# Patient Record
Sex: Male | Born: 1955
Health system: Southern US, Community
[De-identification: ages and names within clinical notes are randomized; demographics above are authoritative.]

---

## 2000-02-11 ENCOUNTER — Encounter: Admission: RE | Admit: 2000-02-11 | Discharge: 2000-02-11 | Payer: Self-pay

## 2000-02-11 ENCOUNTER — Encounter: Payer: Self-pay | Admitting: Otolaryngology

## 2001-12-12 ENCOUNTER — Encounter: Admission: RE | Admit: 2001-12-12 | Discharge: 2001-12-12 | Payer: Self-pay | Admitting: Internal Medicine

## 2001-12-12 ENCOUNTER — Encounter: Payer: Self-pay | Admitting: Internal Medicine

## 2009-10-23 ENCOUNTER — Ambulatory Visit (HOSPITAL_COMMUNITY): Admission: RE | Admit: 2009-10-23 | Discharge: 2009-10-23 | Payer: Self-pay | Admitting: Internal Medicine

## 2010-06-24 ENCOUNTER — Encounter: Admission: RE | Admit: 2010-06-24 | Discharge: 2010-06-24 | Payer: Self-pay | Admitting: Internal Medicine

## 2013-10-30 ENCOUNTER — Other Ambulatory Visit: Payer: Self-pay | Admitting: Internal Medicine

## 2013-10-30 ENCOUNTER — Ambulatory Visit
Admission: RE | Admit: 2013-10-30 | Discharge: 2013-10-30 | Disposition: A | Discharge status: Home / Self Care | Payer: BC Managed Care – PPO | Source: Ambulatory Visit | Attending: Internal Medicine | Admitting: Internal Medicine

## 2013-10-30 DIAGNOSIS — M542 Cervicalgia: Secondary | ICD-10-CM

## 2015-12-31 ENCOUNTER — Telehealth: Payer: Self-pay

## 2015-12-31 NOTE — Telephone Encounter (Signed)
The pt is scheduled to see Dr Eldridge Dace on 01/08/16. Per Dr Eldridge Dace the pt needs to have a GXT without an OV. I have left a message asking the pt to call me back.

## 2016-01-01 NOTE — Telephone Encounter (Signed)
Follow up     Patient calling back to speak with nurse from yesterday - no order in system to schedule GXT

## 2016-01-01 NOTE — Telephone Encounter (Signed)
LMTCB

## 2016-01-02 ENCOUNTER — Other Ambulatory Visit: Payer: Self-pay

## 2016-01-02 DIAGNOSIS — R0602 Shortness of breath: Secondary | ICD-10-CM

## 2016-01-02 NOTE — Telephone Encounter (Signed)
The pt is in agreement with having a GXT and is aware that his 2/16 appt with Dr Eldridge Dace has been cancelled.  I went over GXT instructions with the pt over the phone and answered all of his questions. He did verbalize understanding to all instructions given. He is aware that someone from scheduling will be contacting him to arrange date and time of GXT.

## 2016-01-08 ENCOUNTER — Encounter: Payer: Self-pay | Admitting: Interventional Cardiology

## 2016-01-08 ENCOUNTER — Ambulatory Visit (INDEPENDENT_AMBULATORY_CARE_PROVIDER_SITE_OTHER): Payer: BLUE CROSS/BLUE SHIELD

## 2016-01-08 DIAGNOSIS — R0602 Shortness of breath: Secondary | ICD-10-CM

## 2016-01-08 LAB — EXERCISE TOLERANCE TEST
Estimated workload: 7 METS
Exercise duration (min): 6 min
Exercise duration (sec): 0 s
MPHR: 163 {beats}/min
Peak HR: 157 {beats}/min
Percent HR: 96 %
RPE: 17
Rest HR: 102 {beats}/min

## 2016-02-23 DIAGNOSIS — M549 Dorsalgia, unspecified: Secondary | ICD-10-CM | POA: Diagnosis not present

## 2016-04-29 DIAGNOSIS — G43719 Chronic migraine without aura, intractable, without status migrainosus: Secondary | ICD-10-CM | POA: Diagnosis not present

## 2016-05-06 DIAGNOSIS — Z0000 Encounter for general adult medical examination without abnormal findings: Secondary | ICD-10-CM | POA: Diagnosis not present

## 2016-05-06 DIAGNOSIS — Z1389 Encounter for screening for other disorder: Secondary | ICD-10-CM | POA: Diagnosis not present

## 2016-05-10 DIAGNOSIS — R7309 Other abnormal glucose: Secondary | ICD-10-CM | POA: Diagnosis not present

## 2016-05-10 DIAGNOSIS — E039 Hypothyroidism, unspecified: Secondary | ICD-10-CM | POA: Diagnosis not present

## 2016-05-10 DIAGNOSIS — Z0000 Encounter for general adult medical examination without abnormal findings: Secondary | ICD-10-CM | POA: Diagnosis not present

## 2016-06-09 DIAGNOSIS — G43719 Chronic migraine without aura, intractable, without status migrainosus: Secondary | ICD-10-CM | POA: Diagnosis not present

## 2016-06-17 DIAGNOSIS — G43719 Chronic migraine without aura, intractable, without status migrainosus: Secondary | ICD-10-CM | POA: Diagnosis not present

## 2016-06-24 DIAGNOSIS — E782 Mixed hyperlipidemia: Secondary | ICD-10-CM | POA: Diagnosis not present

## 2016-08-03 DIAGNOSIS — G43719 Chronic migraine without aura, intractable, without status migrainosus: Secondary | ICD-10-CM | POA: Diagnosis not present

## 2016-08-05 DIAGNOSIS — M542 Cervicalgia: Secondary | ICD-10-CM | POA: Diagnosis not present

## 2016-08-05 DIAGNOSIS — G43719 Chronic migraine without aura, intractable, without status migrainosus: Secondary | ICD-10-CM | POA: Diagnosis not present

## 2016-08-05 DIAGNOSIS — G518 Other disorders of facial nerve: Secondary | ICD-10-CM | POA: Diagnosis not present

## 2016-08-05 DIAGNOSIS — M791 Myalgia: Secondary | ICD-10-CM | POA: Diagnosis not present

## 2016-08-10 DIAGNOSIS — Z23 Encounter for immunization: Secondary | ICD-10-CM | POA: Diagnosis not present

## 2016-08-10 DIAGNOSIS — E039 Hypothyroidism, unspecified: Secondary | ICD-10-CM | POA: Diagnosis not present

## 2016-08-10 DIAGNOSIS — I10 Essential (primary) hypertension: Secondary | ICD-10-CM | POA: Diagnosis not present

## 2016-08-19 DIAGNOSIS — G518 Other disorders of facial nerve: Secondary | ICD-10-CM | POA: Diagnosis not present

## 2016-08-19 DIAGNOSIS — M542 Cervicalgia: Secondary | ICD-10-CM | POA: Diagnosis not present

## 2016-08-19 DIAGNOSIS — G43719 Chronic migraine without aura, intractable, without status migrainosus: Secondary | ICD-10-CM | POA: Diagnosis not present

## 2016-08-19 DIAGNOSIS — M791 Myalgia: Secondary | ICD-10-CM | POA: Diagnosis not present

## 2016-09-01 DIAGNOSIS — M542 Cervicalgia: Secondary | ICD-10-CM | POA: Diagnosis not present

## 2016-09-01 DIAGNOSIS — G518 Other disorders of facial nerve: Secondary | ICD-10-CM | POA: Diagnosis not present

## 2016-09-01 DIAGNOSIS — M791 Myalgia: Secondary | ICD-10-CM | POA: Diagnosis not present

## 2016-09-01 DIAGNOSIS — G43719 Chronic migraine without aura, intractable, without status migrainosus: Secondary | ICD-10-CM | POA: Diagnosis not present

## 2016-09-14 DIAGNOSIS — G43719 Chronic migraine without aura, intractable, without status migrainosus: Secondary | ICD-10-CM | POA: Diagnosis not present

## 2016-09-16 DIAGNOSIS — G43719 Chronic migraine without aura, intractable, without status migrainosus: Secondary | ICD-10-CM | POA: Diagnosis not present

## 2016-10-06 DIAGNOSIS — G43719 Chronic migraine without aura, intractable, without status migrainosus: Secondary | ICD-10-CM | POA: Diagnosis not present

## 2016-10-06 DIAGNOSIS — G518 Other disorders of facial nerve: Secondary | ICD-10-CM | POA: Diagnosis not present

## 2016-10-06 DIAGNOSIS — M542 Cervicalgia: Secondary | ICD-10-CM | POA: Diagnosis not present

## 2016-10-06 DIAGNOSIS — M791 Myalgia: Secondary | ICD-10-CM | POA: Diagnosis not present

## 2016-10-11 DIAGNOSIS — Z87891 Personal history of nicotine dependence: Secondary | ICD-10-CM | POA: Diagnosis not present

## 2016-10-11 DIAGNOSIS — R040 Epistaxis: Secondary | ICD-10-CM | POA: Diagnosis not present

## 2016-10-11 DIAGNOSIS — J342 Deviated nasal septum: Secondary | ICD-10-CM | POA: Diagnosis not present

## 2016-10-11 DIAGNOSIS — J339 Nasal polyp, unspecified: Secondary | ICD-10-CM | POA: Diagnosis not present

## 2016-10-21 DIAGNOSIS — G43719 Chronic migraine without aura, intractable, without status migrainosus: Secondary | ICD-10-CM | POA: Diagnosis not present

## 2016-10-21 DIAGNOSIS — G518 Other disorders of facial nerve: Secondary | ICD-10-CM | POA: Diagnosis not present

## 2016-10-21 DIAGNOSIS — M542 Cervicalgia: Secondary | ICD-10-CM | POA: Diagnosis not present

## 2016-10-21 DIAGNOSIS — M791 Myalgia: Secondary | ICD-10-CM | POA: Diagnosis not present

## 2016-10-27 DIAGNOSIS — M5432 Sciatica, left side: Secondary | ICD-10-CM | POA: Diagnosis not present

## 2016-11-05 ENCOUNTER — Ambulatory Visit
Admission: RE | Admit: 2016-11-05 | Discharge: 2016-11-05 | Disposition: A | Discharge status: Home / Self Care | Payer: BLUE CROSS/BLUE SHIELD | Source: Ambulatory Visit | Attending: Internal Medicine | Admitting: Internal Medicine

## 2016-11-05 ENCOUNTER — Other Ambulatory Visit: Payer: Self-pay | Admitting: Internal Medicine

## 2016-11-05 DIAGNOSIS — M25552 Pain in left hip: Secondary | ICD-10-CM

## 2016-11-05 DIAGNOSIS — M25559 Pain in unspecified hip: Secondary | ICD-10-CM | POA: Diagnosis not present

## 2016-11-10 DIAGNOSIS — G43719 Chronic migraine without aura, intractable, without status migrainosus: Secondary | ICD-10-CM | POA: Diagnosis not present

## 2016-11-10 DIAGNOSIS — M542 Cervicalgia: Secondary | ICD-10-CM | POA: Diagnosis not present

## 2016-11-10 DIAGNOSIS — M791 Myalgia: Secondary | ICD-10-CM | POA: Diagnosis not present

## 2016-11-10 DIAGNOSIS — G518 Other disorders of facial nerve: Secondary | ICD-10-CM | POA: Diagnosis not present

## 2016-11-12 DIAGNOSIS — E7800 Pure hypercholesterolemia, unspecified: Secondary | ICD-10-CM | POA: Diagnosis not present

## 2016-11-12 DIAGNOSIS — K219 Gastro-esophageal reflux disease without esophagitis: Secondary | ICD-10-CM | POA: Diagnosis not present

## 2016-11-12 DIAGNOSIS — I10 Essential (primary) hypertension: Secondary | ICD-10-CM | POA: Diagnosis not present

## 2016-11-12 DIAGNOSIS — E039 Hypothyroidism, unspecified: Secondary | ICD-10-CM | POA: Diagnosis not present

## 2016-11-24 DIAGNOSIS — M25552 Pain in left hip: Secondary | ICD-10-CM | POA: Diagnosis not present

## 2016-11-30 DIAGNOSIS — M25552 Pain in left hip: Secondary | ICD-10-CM | POA: Diagnosis not present

## 2016-12-02 DIAGNOSIS — M25552 Pain in left hip: Secondary | ICD-10-CM | POA: Diagnosis not present

## 2016-12-07 DIAGNOSIS — M25552 Pain in left hip: Secondary | ICD-10-CM | POA: Diagnosis not present

## 2016-12-10 DIAGNOSIS — G43719 Chronic migraine without aura, intractable, without status migrainosus: Secondary | ICD-10-CM | POA: Diagnosis not present

## 2016-12-14 DIAGNOSIS — M25552 Pain in left hip: Secondary | ICD-10-CM | POA: Diagnosis not present

## 2016-12-16 DIAGNOSIS — G43719 Chronic migraine without aura, intractable, without status migrainosus: Secondary | ICD-10-CM | POA: Diagnosis not present

## 2016-12-16 DIAGNOSIS — M25552 Pain in left hip: Secondary | ICD-10-CM | POA: Diagnosis not present

## 2016-12-22 DIAGNOSIS — M25552 Pain in left hip: Secondary | ICD-10-CM | POA: Diagnosis not present

## 2016-12-30 DIAGNOSIS — M25552 Pain in left hip: Secondary | ICD-10-CM | POA: Diagnosis not present

## 2017-01-11 DIAGNOSIS — M25552 Pain in left hip: Secondary | ICD-10-CM | POA: Diagnosis not present

## 2017-01-20 DIAGNOSIS — F410 Panic disorder [episodic paroxysmal anxiety] without agoraphobia: Secondary | ICD-10-CM | POA: Diagnosis not present

## 2017-01-27 DIAGNOSIS — M542 Cervicalgia: Secondary | ICD-10-CM | POA: Diagnosis not present

## 2017-01-27 DIAGNOSIS — G43719 Chronic migraine without aura, intractable, without status migrainosus: Secondary | ICD-10-CM | POA: Diagnosis not present

## 2017-01-27 DIAGNOSIS — G518 Other disorders of facial nerve: Secondary | ICD-10-CM | POA: Diagnosis not present

## 2017-01-27 DIAGNOSIS — M791 Myalgia: Secondary | ICD-10-CM | POA: Diagnosis not present

## 2017-02-02 DIAGNOSIS — J069 Acute upper respiratory infection, unspecified: Secondary | ICD-10-CM | POA: Diagnosis not present

## 2017-02-02 DIAGNOSIS — J310 Chronic rhinitis: Secondary | ICD-10-CM | POA: Diagnosis not present

## 2017-03-02 DIAGNOSIS — G43719 Chronic migraine without aura, intractable, without status migrainosus: Secondary | ICD-10-CM | POA: Diagnosis not present

## 2017-03-17 DIAGNOSIS — G43719 Chronic migraine without aura, intractable, without status migrainosus: Secondary | ICD-10-CM | POA: Diagnosis not present

## 2017-04-21 DIAGNOSIS — M542 Cervicalgia: Secondary | ICD-10-CM | POA: Diagnosis not present

## 2017-04-21 DIAGNOSIS — G518 Other disorders of facial nerve: Secondary | ICD-10-CM | POA: Diagnosis not present

## 2017-04-21 DIAGNOSIS — G43719 Chronic migraine without aura, intractable, without status migrainosus: Secondary | ICD-10-CM | POA: Diagnosis not present

## 2017-04-21 DIAGNOSIS — M791 Myalgia: Secondary | ICD-10-CM | POA: Diagnosis not present

## 2017-05-19 DIAGNOSIS — Z0000 Encounter for general adult medical examination without abnormal findings: Secondary | ICD-10-CM | POA: Diagnosis not present

## 2017-05-19 DIAGNOSIS — J310 Chronic rhinitis: Secondary | ICD-10-CM | POA: Diagnosis not present

## 2017-05-19 DIAGNOSIS — E7800 Pure hypercholesterolemia, unspecified: Secondary | ICD-10-CM | POA: Diagnosis not present

## 2017-05-19 DIAGNOSIS — Z1389 Encounter for screening for other disorder: Secondary | ICD-10-CM | POA: Diagnosis not present

## 2017-05-19 DIAGNOSIS — E039 Hypothyroidism, unspecified: Secondary | ICD-10-CM | POA: Diagnosis not present

## 2017-05-19 DIAGNOSIS — K219 Gastro-esophageal reflux disease without esophagitis: Secondary | ICD-10-CM | POA: Diagnosis not present

## 2017-06-01 DIAGNOSIS — J069 Acute upper respiratory infection, unspecified: Secondary | ICD-10-CM | POA: Diagnosis not present

## 2017-06-04 DIAGNOSIS — G43719 Chronic migraine without aura, intractable, without status migrainosus: Secondary | ICD-10-CM | POA: Diagnosis not present

## 2017-06-16 DIAGNOSIS — G518 Other disorders of facial nerve: Secondary | ICD-10-CM | POA: Diagnosis not present

## 2017-06-16 DIAGNOSIS — M542 Cervicalgia: Secondary | ICD-10-CM | POA: Diagnosis not present

## 2017-06-16 DIAGNOSIS — G43719 Chronic migraine without aura, intractable, without status migrainosus: Secondary | ICD-10-CM | POA: Diagnosis not present

## 2017-06-16 DIAGNOSIS — M791 Myalgia: Secondary | ICD-10-CM | POA: Diagnosis not present

## 2017-08-04 DIAGNOSIS — G43719 Chronic migraine without aura, intractable, without status migrainosus: Secondary | ICD-10-CM | POA: Diagnosis not present

## 2017-08-04 DIAGNOSIS — M542 Cervicalgia: Secondary | ICD-10-CM | POA: Diagnosis not present

## 2017-08-04 DIAGNOSIS — M791 Myalgia: Secondary | ICD-10-CM | POA: Diagnosis not present

## 2017-08-04 DIAGNOSIS — G518 Other disorders of facial nerve: Secondary | ICD-10-CM | POA: Diagnosis not present

## 2017-08-08 DIAGNOSIS — F410 Panic disorder [episodic paroxysmal anxiety] without agoraphobia: Secondary | ICD-10-CM | POA: Diagnosis not present

## 2017-09-05 DIAGNOSIS — G43719 Chronic migraine without aura, intractable, without status migrainosus: Secondary | ICD-10-CM | POA: Diagnosis not present

## 2017-09-10 DIAGNOSIS — Z23 Encounter for immunization: Secondary | ICD-10-CM | POA: Diagnosis not present

## 2017-09-14 DIAGNOSIS — G518 Other disorders of facial nerve: Secondary | ICD-10-CM | POA: Diagnosis not present

## 2017-09-14 DIAGNOSIS — M542 Cervicalgia: Secondary | ICD-10-CM | POA: Diagnosis not present

## 2017-09-14 DIAGNOSIS — M791 Myalgia, unspecified site: Secondary | ICD-10-CM | POA: Diagnosis not present

## 2017-09-14 DIAGNOSIS — G43719 Chronic migraine without aura, intractable, without status migrainosus: Secondary | ICD-10-CM | POA: Diagnosis not present

## 2017-09-27 DIAGNOSIS — F410 Panic disorder [episodic paroxysmal anxiety] without agoraphobia: Secondary | ICD-10-CM | POA: Diagnosis not present

## 2017-10-26 DIAGNOSIS — G518 Other disorders of facial nerve: Secondary | ICD-10-CM | POA: Diagnosis not present

## 2017-10-26 DIAGNOSIS — M542 Cervicalgia: Secondary | ICD-10-CM | POA: Diagnosis not present

## 2017-10-26 DIAGNOSIS — G43719 Chronic migraine without aura, intractable, without status migrainosus: Secondary | ICD-10-CM | POA: Diagnosis not present

## 2017-10-26 DIAGNOSIS — M791 Myalgia, unspecified site: Secondary | ICD-10-CM | POA: Diagnosis not present

## 2017-12-01 DIAGNOSIS — K219 Gastro-esophageal reflux disease without esophagitis: Secondary | ICD-10-CM | POA: Diagnosis not present

## 2017-12-01 DIAGNOSIS — E039 Hypothyroidism, unspecified: Secondary | ICD-10-CM | POA: Diagnosis not present

## 2017-12-01 DIAGNOSIS — I10 Essential (primary) hypertension: Secondary | ICD-10-CM | POA: Diagnosis not present

## 2017-12-01 DIAGNOSIS — J310 Chronic rhinitis: Secondary | ICD-10-CM | POA: Diagnosis not present

## 2017-12-15 DIAGNOSIS — G43719 Chronic migraine without aura, intractable, without status migrainosus: Secondary | ICD-10-CM | POA: Diagnosis not present

## 2017-12-20 DIAGNOSIS — M542 Cervicalgia: Secondary | ICD-10-CM | POA: Diagnosis not present

## 2017-12-20 DIAGNOSIS — G518 Other disorders of facial nerve: Secondary | ICD-10-CM | POA: Diagnosis not present

## 2017-12-20 DIAGNOSIS — M791 Myalgia, unspecified site: Secondary | ICD-10-CM | POA: Diagnosis not present

## 2017-12-20 DIAGNOSIS — G43719 Chronic migraine without aura, intractable, without status migrainosus: Secondary | ICD-10-CM | POA: Diagnosis not present

## 2018-01-27 DIAGNOSIS — J069 Acute upper respiratory infection, unspecified: Secondary | ICD-10-CM | POA: Diagnosis not present

## 2018-01-27 DIAGNOSIS — J111 Influenza due to unidentified influenza virus with other respiratory manifestations: Secondary | ICD-10-CM | POA: Diagnosis not present

## 2018-02-15 DIAGNOSIS — M791 Myalgia, unspecified site: Secondary | ICD-10-CM | POA: Diagnosis not present

## 2018-02-15 DIAGNOSIS — G518 Other disorders of facial nerve: Secondary | ICD-10-CM | POA: Diagnosis not present

## 2018-02-15 DIAGNOSIS — M542 Cervicalgia: Secondary | ICD-10-CM | POA: Diagnosis not present

## 2018-02-15 DIAGNOSIS — G43719 Chronic migraine without aura, intractable, without status migrainosus: Secondary | ICD-10-CM | POA: Diagnosis not present

## 2018-02-21 DIAGNOSIS — J309 Allergic rhinitis, unspecified: Secondary | ICD-10-CM | POA: Diagnosis not present

## 2018-02-27 DIAGNOSIS — J339 Nasal polyp, unspecified: Secondary | ICD-10-CM | POA: Diagnosis not present

## 2018-02-27 DIAGNOSIS — J342 Deviated nasal septum: Secondary | ICD-10-CM | POA: Diagnosis not present

## 2018-03-07 DIAGNOSIS — G43719 Chronic migraine without aura, intractable, without status migrainosus: Secondary | ICD-10-CM | POA: Diagnosis not present

## 2018-03-14 DIAGNOSIS — M542 Cervicalgia: Secondary | ICD-10-CM | POA: Diagnosis not present

## 2018-03-14 DIAGNOSIS — G518 Other disorders of facial nerve: Secondary | ICD-10-CM | POA: Diagnosis not present

## 2018-03-14 DIAGNOSIS — M791 Myalgia, unspecified site: Secondary | ICD-10-CM | POA: Diagnosis not present

## 2018-03-14 DIAGNOSIS — G43719 Chronic migraine without aura, intractable, without status migrainosus: Secondary | ICD-10-CM | POA: Diagnosis not present

## 2018-04-27 DIAGNOSIS — M542 Cervicalgia: Secondary | ICD-10-CM | POA: Diagnosis not present

## 2018-04-27 DIAGNOSIS — M791 Myalgia, unspecified site: Secondary | ICD-10-CM | POA: Diagnosis not present

## 2018-04-27 DIAGNOSIS — G518 Other disorders of facial nerve: Secondary | ICD-10-CM | POA: Diagnosis not present

## 2018-04-27 DIAGNOSIS — G43719 Chronic migraine without aura, intractable, without status migrainosus: Secondary | ICD-10-CM | POA: Diagnosis not present

## 2018-05-16 DIAGNOSIS — G43719 Chronic migraine without aura, intractable, without status migrainosus: Secondary | ICD-10-CM | POA: Diagnosis not present

## 2018-05-22 DIAGNOSIS — Z0000 Encounter for general adult medical examination without abnormal findings: Secondary | ICD-10-CM | POA: Diagnosis not present

## 2018-05-22 DIAGNOSIS — E039 Hypothyroidism, unspecified: Secondary | ICD-10-CM | POA: Diagnosis not present

## 2018-05-23 DIAGNOSIS — E039 Hypothyroidism, unspecified: Secondary | ICD-10-CM | POA: Diagnosis not present

## 2018-05-23 DIAGNOSIS — Z0000 Encounter for general adult medical examination without abnormal findings: Secondary | ICD-10-CM | POA: Diagnosis not present

## 2018-05-23 DIAGNOSIS — I10 Essential (primary) hypertension: Secondary | ICD-10-CM | POA: Diagnosis not present

## 2018-05-23 DIAGNOSIS — E782 Mixed hyperlipidemia: Secondary | ICD-10-CM | POA: Diagnosis not present

## 2018-06-06 DIAGNOSIS — G518 Other disorders of facial nerve: Secondary | ICD-10-CM | POA: Diagnosis not present

## 2018-06-06 DIAGNOSIS — M791 Myalgia, unspecified site: Secondary | ICD-10-CM | POA: Diagnosis not present

## 2018-06-06 DIAGNOSIS — M542 Cervicalgia: Secondary | ICD-10-CM | POA: Diagnosis not present

## 2018-06-06 DIAGNOSIS — G43719 Chronic migraine without aura, intractable, without status migrainosus: Secondary | ICD-10-CM | POA: Diagnosis not present

## 2018-06-21 DIAGNOSIS — G43719 Chronic migraine without aura, intractable, without status migrainosus: Secondary | ICD-10-CM | POA: Diagnosis not present

## 2018-07-14 DIAGNOSIS — E782 Mixed hyperlipidemia: Secondary | ICD-10-CM | POA: Diagnosis not present

## 2018-07-17 DIAGNOSIS — Z0000 Encounter for general adult medical examination without abnormal findings: Secondary | ICD-10-CM | POA: Diagnosis not present

## 2018-07-19 DIAGNOSIS — G43719 Chronic migraine without aura, intractable, without status migrainosus: Secondary | ICD-10-CM | POA: Diagnosis not present

## 2018-07-19 DIAGNOSIS — M542 Cervicalgia: Secondary | ICD-10-CM | POA: Diagnosis not present

## 2018-07-19 DIAGNOSIS — G518 Other disorders of facial nerve: Secondary | ICD-10-CM | POA: Diagnosis not present

## 2018-07-19 DIAGNOSIS — M791 Myalgia, unspecified site: Secondary | ICD-10-CM | POA: Diagnosis not present

## 2018-08-05 DIAGNOSIS — Z23 Encounter for immunization: Secondary | ICD-10-CM | POA: Diagnosis not present

## 2018-08-09 DIAGNOSIS — G43719 Chronic migraine without aura, intractable, without status migrainosus: Secondary | ICD-10-CM | POA: Diagnosis not present

## 2018-08-17 DIAGNOSIS — K219 Gastro-esophageal reflux disease without esophagitis: Secondary | ICD-10-CM | POA: Diagnosis not present

## 2018-08-17 DIAGNOSIS — R195 Other fecal abnormalities: Secondary | ICD-10-CM | POA: Diagnosis not present

## 2018-09-06 DIAGNOSIS — G518 Other disorders of facial nerve: Secondary | ICD-10-CM | POA: Diagnosis not present

## 2018-09-06 DIAGNOSIS — G43719 Chronic migraine without aura, intractable, without status migrainosus: Secondary | ICD-10-CM | POA: Diagnosis not present

## 2018-09-06 DIAGNOSIS — M542 Cervicalgia: Secondary | ICD-10-CM | POA: Diagnosis not present

## 2018-09-06 DIAGNOSIS — M791 Myalgia, unspecified site: Secondary | ICD-10-CM | POA: Diagnosis not present

## 2018-09-13 DIAGNOSIS — M25511 Pain in right shoulder: Secondary | ICD-10-CM | POA: Diagnosis not present

## 2018-09-27 DIAGNOSIS — M25511 Pain in right shoulder: Secondary | ICD-10-CM | POA: Diagnosis not present

## 2018-10-11 DIAGNOSIS — M542 Cervicalgia: Secondary | ICD-10-CM | POA: Diagnosis not present

## 2018-10-11 DIAGNOSIS — G518 Other disorders of facial nerve: Secondary | ICD-10-CM | POA: Diagnosis not present

## 2018-10-11 DIAGNOSIS — M791 Myalgia, unspecified site: Secondary | ICD-10-CM | POA: Diagnosis not present

## 2018-10-11 DIAGNOSIS — G43719 Chronic migraine without aura, intractable, without status migrainosus: Secondary | ICD-10-CM | POA: Diagnosis not present

## 2018-11-24 DIAGNOSIS — E782 Mixed hyperlipidemia: Secondary | ICD-10-CM | POA: Diagnosis not present

## 2018-11-27 DIAGNOSIS — E039 Hypothyroidism, unspecified: Secondary | ICD-10-CM | POA: Diagnosis not present

## 2018-11-27 DIAGNOSIS — E782 Mixed hyperlipidemia: Secondary | ICD-10-CM | POA: Diagnosis not present

## 2018-11-27 DIAGNOSIS — Z1389 Encounter for screening for other disorder: Secondary | ICD-10-CM | POA: Diagnosis not present

## 2018-11-27 DIAGNOSIS — R51 Headache: Secondary | ICD-10-CM | POA: Diagnosis not present

## 2018-11-27 DIAGNOSIS — I10 Essential (primary) hypertension: Secondary | ICD-10-CM | POA: Diagnosis not present

## 2018-12-04 DIAGNOSIS — G43719 Chronic migraine without aura, intractable, without status migrainosus: Secondary | ICD-10-CM | POA: Diagnosis not present

## 2018-12-06 DIAGNOSIS — M791 Myalgia, unspecified site: Secondary | ICD-10-CM | POA: Diagnosis not present

## 2018-12-06 DIAGNOSIS — G43719 Chronic migraine without aura, intractable, without status migrainosus: Secondary | ICD-10-CM | POA: Diagnosis not present

## 2018-12-06 DIAGNOSIS — G518 Other disorders of facial nerve: Secondary | ICD-10-CM | POA: Diagnosis not present

## 2018-12-06 DIAGNOSIS — M542 Cervicalgia: Secondary | ICD-10-CM | POA: Diagnosis not present

## 2019-01-02 DIAGNOSIS — M7918 Myalgia, other site: Secondary | ICD-10-CM | POA: Diagnosis not present

## 2019-01-02 DIAGNOSIS — M5431 Sciatica, right side: Secondary | ICD-10-CM | POA: Diagnosis not present

## 2019-01-10 DIAGNOSIS — M79604 Pain in right leg: Secondary | ICD-10-CM | POA: Diagnosis not present

## 2019-01-10 DIAGNOSIS — M545 Low back pain: Secondary | ICD-10-CM | POA: Diagnosis not present

## 2019-01-15 DIAGNOSIS — M545 Low back pain: Secondary | ICD-10-CM | POA: Diagnosis not present

## 2019-01-15 DIAGNOSIS — M79604 Pain in right leg: Secondary | ICD-10-CM | POA: Diagnosis not present

## 2019-01-17 DIAGNOSIS — J339 Nasal polyp, unspecified: Secondary | ICD-10-CM | POA: Diagnosis not present

## 2019-01-17 DIAGNOSIS — J342 Deviated nasal septum: Secondary | ICD-10-CM | POA: Diagnosis not present

## 2019-01-17 DIAGNOSIS — J329 Chronic sinusitis, unspecified: Secondary | ICD-10-CM | POA: Diagnosis not present

## 2019-01-22 DIAGNOSIS — M545 Low back pain: Secondary | ICD-10-CM | POA: Diagnosis not present

## 2019-01-22 DIAGNOSIS — M79604 Pain in right leg: Secondary | ICD-10-CM | POA: Diagnosis not present

## 2019-01-24 DIAGNOSIS — M545 Low back pain: Secondary | ICD-10-CM | POA: Diagnosis not present

## 2019-01-24 DIAGNOSIS — M79604 Pain in right leg: Secondary | ICD-10-CM | POA: Diagnosis not present

## 2019-01-25 DIAGNOSIS — G43719 Chronic migraine without aura, intractable, without status migrainosus: Secondary | ICD-10-CM | POA: Diagnosis not present

## 2019-01-25 DIAGNOSIS — M791 Myalgia, unspecified site: Secondary | ICD-10-CM | POA: Diagnosis not present

## 2019-01-25 DIAGNOSIS — M542 Cervicalgia: Secondary | ICD-10-CM | POA: Diagnosis not present

## 2019-01-25 DIAGNOSIS — G518 Other disorders of facial nerve: Secondary | ICD-10-CM | POA: Diagnosis not present

## 2019-01-29 DIAGNOSIS — M545 Low back pain: Secondary | ICD-10-CM | POA: Diagnosis not present

## 2019-01-29 DIAGNOSIS — M79604 Pain in right leg: Secondary | ICD-10-CM | POA: Diagnosis not present

## 2019-01-31 DIAGNOSIS — D124 Benign neoplasm of descending colon: Secondary | ICD-10-CM | POA: Diagnosis not present

## 2019-01-31 DIAGNOSIS — R195 Other fecal abnormalities: Secondary | ICD-10-CM | POA: Diagnosis not present

## 2019-02-01 DIAGNOSIS — M79604 Pain in right leg: Secondary | ICD-10-CM | POA: Diagnosis not present

## 2019-02-01 DIAGNOSIS — M545 Low back pain: Secondary | ICD-10-CM | POA: Diagnosis not present

## 2019-02-02 DIAGNOSIS — D124 Benign neoplasm of descending colon: Secondary | ICD-10-CM | POA: Diagnosis not present

## 2019-02-06 DIAGNOSIS — M545 Low back pain: Secondary | ICD-10-CM | POA: Diagnosis not present

## 2019-02-06 DIAGNOSIS — M79604 Pain in right leg: Secondary | ICD-10-CM | POA: Diagnosis not present

## 2019-02-08 DIAGNOSIS — M545 Low back pain: Secondary | ICD-10-CM | POA: Diagnosis not present

## 2019-02-08 DIAGNOSIS — M79604 Pain in right leg: Secondary | ICD-10-CM | POA: Diagnosis not present

## 2019-02-12 DIAGNOSIS — M545 Low back pain: Secondary | ICD-10-CM | POA: Diagnosis not present

## 2019-02-12 DIAGNOSIS — M79604 Pain in right leg: Secondary | ICD-10-CM | POA: Diagnosis not present

## 2019-02-15 DIAGNOSIS — M79604 Pain in right leg: Secondary | ICD-10-CM | POA: Diagnosis not present

## 2019-02-15 DIAGNOSIS — M545 Low back pain: Secondary | ICD-10-CM | POA: Diagnosis not present

## 2019-02-20 DIAGNOSIS — M545 Low back pain: Secondary | ICD-10-CM | POA: Diagnosis not present

## 2019-02-20 DIAGNOSIS — M79604 Pain in right leg: Secondary | ICD-10-CM | POA: Diagnosis not present

## 2019-02-22 DIAGNOSIS — M79604 Pain in right leg: Secondary | ICD-10-CM | POA: Diagnosis not present

## 2019-02-22 DIAGNOSIS — M545 Low back pain: Secondary | ICD-10-CM | POA: Diagnosis not present

## 2019-03-19 DIAGNOSIS — G43719 Chronic migraine without aura, intractable, without status migrainosus: Secondary | ICD-10-CM | POA: Diagnosis not present

## 2019-03-21 DIAGNOSIS — G43719 Chronic migraine without aura, intractable, without status migrainosus: Secondary | ICD-10-CM | POA: Diagnosis not present

## 2019-03-21 DIAGNOSIS — M542 Cervicalgia: Secondary | ICD-10-CM | POA: Diagnosis not present

## 2019-03-21 DIAGNOSIS — G518 Other disorders of facial nerve: Secondary | ICD-10-CM | POA: Diagnosis not present

## 2019-03-21 DIAGNOSIS — M791 Myalgia, unspecified site: Secondary | ICD-10-CM | POA: Diagnosis not present

## 2019-04-09 DIAGNOSIS — M5136 Other intervertebral disc degeneration, lumbar region: Secondary | ICD-10-CM | POA: Diagnosis not present

## 2019-04-26 DIAGNOSIS — J040 Acute laryngitis: Secondary | ICD-10-CM | POA: Diagnosis not present

## 2019-04-26 DIAGNOSIS — J309 Allergic rhinitis, unspecified: Secondary | ICD-10-CM | POA: Diagnosis not present

## 2019-04-26 DIAGNOSIS — R0981 Nasal congestion: Secondary | ICD-10-CM | POA: Diagnosis not present

## 2019-05-03 DIAGNOSIS — M542 Cervicalgia: Secondary | ICD-10-CM | POA: Diagnosis not present

## 2019-05-03 DIAGNOSIS — G43719 Chronic migraine without aura, intractable, without status migrainosus: Secondary | ICD-10-CM | POA: Diagnosis not present

## 2019-05-24 DIAGNOSIS — Z5181 Encounter for therapeutic drug level monitoring: Secondary | ICD-10-CM | POA: Diagnosis not present

## 2019-05-24 DIAGNOSIS — E039 Hypothyroidism, unspecified: Secondary | ICD-10-CM | POA: Diagnosis not present

## 2019-05-24 DIAGNOSIS — E782 Mixed hyperlipidemia: Secondary | ICD-10-CM | POA: Diagnosis not present

## 2019-05-24 DIAGNOSIS — Z131 Encounter for screening for diabetes mellitus: Secondary | ICD-10-CM | POA: Diagnosis not present

## 2019-05-24 DIAGNOSIS — Z125 Encounter for screening for malignant neoplasm of prostate: Secondary | ICD-10-CM | POA: Diagnosis not present

## 2019-05-24 DIAGNOSIS — I10 Essential (primary) hypertension: Secondary | ICD-10-CM | POA: Diagnosis not present

## 2019-05-28 DIAGNOSIS — E782 Mixed hyperlipidemia: Secondary | ICD-10-CM | POA: Diagnosis not present

## 2019-05-28 DIAGNOSIS — Z0000 Encounter for general adult medical examination without abnormal findings: Secondary | ICD-10-CM | POA: Diagnosis not present

## 2019-05-28 DIAGNOSIS — I10 Essential (primary) hypertension: Secondary | ICD-10-CM | POA: Diagnosis not present

## 2019-05-28 DIAGNOSIS — K219 Gastro-esophageal reflux disease without esophagitis: Secondary | ICD-10-CM | POA: Diagnosis not present

## 2019-06-20 DIAGNOSIS — G43719 Chronic migraine without aura, intractable, without status migrainosus: Secondary | ICD-10-CM | POA: Diagnosis not present

## 2019-07-04 DIAGNOSIS — G43719 Chronic migraine without aura, intractable, without status migrainosus: Secondary | ICD-10-CM | POA: Diagnosis not present

## 2019-07-04 DIAGNOSIS — M542 Cervicalgia: Secondary | ICD-10-CM | POA: Diagnosis not present

## 2019-08-08 DIAGNOSIS — M545 Low back pain: Secondary | ICD-10-CM | POA: Diagnosis not present

## 2019-08-08 DIAGNOSIS — M519 Unspecified thoracic, thoracolumbar and lumbosacral intervertebral disc disorder: Secondary | ICD-10-CM | POA: Diagnosis not present

## 2019-08-16 DIAGNOSIS — M542 Cervicalgia: Secondary | ICD-10-CM | POA: Diagnosis not present

## 2019-08-16 DIAGNOSIS — G43719 Chronic migraine without aura, intractable, without status migrainosus: Secondary | ICD-10-CM | POA: Diagnosis not present

## 2019-09-13 DIAGNOSIS — G43719 Chronic migraine without aura, intractable, without status migrainosus: Secondary | ICD-10-CM | POA: Diagnosis not present

## 2019-10-03 DIAGNOSIS — G43719 Chronic migraine without aura, intractable, without status migrainosus: Secondary | ICD-10-CM | POA: Diagnosis not present

## 2019-10-03 DIAGNOSIS — M542 Cervicalgia: Secondary | ICD-10-CM | POA: Diagnosis not present

## 2019-11-10 DIAGNOSIS — Z20828 Contact with and (suspected) exposure to other viral communicable diseases: Secondary | ICD-10-CM | POA: Diagnosis not present

## 2019-11-13 DIAGNOSIS — G43719 Chronic migraine without aura, intractable, without status migrainosus: Secondary | ICD-10-CM | POA: Diagnosis not present

## 2019-11-13 DIAGNOSIS — M542 Cervicalgia: Secondary | ICD-10-CM | POA: Diagnosis not present

## 2019-11-29 DIAGNOSIS — I10 Essential (primary) hypertension: Secondary | ICD-10-CM | POA: Diagnosis not present

## 2019-11-29 DIAGNOSIS — Z23 Encounter for immunization: Secondary | ICD-10-CM | POA: Diagnosis not present

## 2019-11-29 DIAGNOSIS — K219 Gastro-esophageal reflux disease without esophagitis: Secondary | ICD-10-CM | POA: Diagnosis not present

## 2019-11-29 DIAGNOSIS — F410 Panic disorder [episodic paroxysmal anxiety] without agoraphobia: Secondary | ICD-10-CM | POA: Diagnosis not present

## 2019-11-29 DIAGNOSIS — E7800 Pure hypercholesterolemia, unspecified: Secondary | ICD-10-CM | POA: Diagnosis not present

## 2019-12-20 DIAGNOSIS — G43719 Chronic migraine without aura, intractable, without status migrainosus: Secondary | ICD-10-CM | POA: Diagnosis not present

## 2020-01-02 DIAGNOSIS — G43719 Chronic migraine without aura, intractable, without status migrainosus: Secondary | ICD-10-CM | POA: Diagnosis not present

## 2020-01-02 DIAGNOSIS — M542 Cervicalgia: Secondary | ICD-10-CM | POA: Diagnosis not present

## 2020-02-13 DIAGNOSIS — M542 Cervicalgia: Secondary | ICD-10-CM | POA: Diagnosis not present

## 2020-02-13 DIAGNOSIS — G43719 Chronic migraine without aura, intractable, without status migrainosus: Secondary | ICD-10-CM | POA: Diagnosis not present

## 2020-02-19 DIAGNOSIS — R197 Diarrhea, unspecified: Secondary | ICD-10-CM | POA: Diagnosis not present

## 2020-02-19 DIAGNOSIS — R509 Fever, unspecified: Secondary | ICD-10-CM | POA: Diagnosis not present

## 2020-02-19 DIAGNOSIS — Z1152 Encounter for screening for COVID-19: Secondary | ICD-10-CM | POA: Diagnosis not present

## 2020-02-21 DIAGNOSIS — R509 Fever, unspecified: Secondary | ICD-10-CM | POA: Diagnosis not present

## 2020-02-21 DIAGNOSIS — R197 Diarrhea, unspecified: Secondary | ICD-10-CM | POA: Diagnosis not present

## 2020-03-17 DIAGNOSIS — G43719 Chronic migraine without aura, intractable, without status migrainosus: Secondary | ICD-10-CM | POA: Diagnosis not present

## 2020-04-02 DIAGNOSIS — G43719 Chronic migraine without aura, intractable, without status migrainosus: Secondary | ICD-10-CM | POA: Diagnosis not present

## 2020-04-02 DIAGNOSIS — M542 Cervicalgia: Secondary | ICD-10-CM | POA: Diagnosis not present

## 2020-05-13 ENCOUNTER — Telehealth: Payer: Self-pay

## 2020-05-13 DIAGNOSIS — I10 Essential (primary) hypertension: Secondary | ICD-10-CM

## 2020-05-13 DIAGNOSIS — R0602 Shortness of breath: Secondary | ICD-10-CM

## 2020-05-13 NOTE — Telephone Encounter (Signed)
-----   Message from Corky Crafts, MD sent at 05/08/2020  2:35 PM EDT ----- Regarding: RE: calcium scoring CT That is fine.  I can do that.  OK to order.  He is aware of the cost.  Do you call him or does he just have to call and schedule once order is in?  JV ----- Message ----- From: Lattie Haw, RN Sent: 05/08/2020  10:59 AM EDT To: Corky Crafts, MD, Lattie Haw, RN Subject: RE: calcium scoring CT                         Yes we can order a Calcium score CT without a visit since it is not ran through insurance as long as we call results and address any findings.  ----- Message ----- From: Corky Crafts, MD Sent: 05/07/2020   4:06 PM EDT To: Lattie Haw, RN Subject: calcium scoring CT                             [place],   I saw this patient years ago but not recently.  Can I still order a calcium scoring CT scan without a visit?  JV

## 2020-05-13 NOTE — Telephone Encounter (Signed)
Left message for patient to call back  

## 2020-05-14 DIAGNOSIS — G43719 Chronic migraine without aura, intractable, without status migrainosus: Secondary | ICD-10-CM | POA: Diagnosis not present

## 2020-05-14 DIAGNOSIS — M542 Cervicalgia: Secondary | ICD-10-CM | POA: Diagnosis not present

## 2020-05-14 NOTE — Telephone Encounter (Signed)
Left message to call back  Order for Calcium Score CT has been placed.

## 2020-05-27 ENCOUNTER — Other Ambulatory Visit: Payer: Self-pay

## 2020-05-27 ENCOUNTER — Ambulatory Visit (INDEPENDENT_AMBULATORY_CARE_PROVIDER_SITE_OTHER)
Admission: RE | Admit: 2020-05-27 | Discharge: 2020-05-27 | Disposition: A | Discharge status: Home / Self Care | Payer: Self-pay | Source: Ambulatory Visit | Attending: Interventional Cardiology | Admitting: Interventional Cardiology

## 2020-05-27 DIAGNOSIS — R0602 Shortness of breath: Secondary | ICD-10-CM

## 2020-05-27 DIAGNOSIS — I10 Essential (primary) hypertension: Secondary | ICD-10-CM

## 2020-05-28 NOTE — Telephone Encounter (Signed)
Patient had Calcium Score CT done yesterday.

## 2020-05-30 ENCOUNTER — Other Ambulatory Visit: Payer: Self-pay

## 2020-05-30 DIAGNOSIS — I2510 Atherosclerotic heart disease of native coronary artery without angina pectoris: Secondary | ICD-10-CM

## 2020-05-30 DIAGNOSIS — R931 Abnormal findings on diagnostic imaging of heart and coronary circulation: Secondary | ICD-10-CM

## 2020-06-03 DIAGNOSIS — Z0000 Encounter for general adult medical examination without abnormal findings: Secondary | ICD-10-CM | POA: Diagnosis not present

## 2020-06-03 DIAGNOSIS — E782 Mixed hyperlipidemia: Secondary | ICD-10-CM | POA: Diagnosis not present

## 2020-06-03 DIAGNOSIS — Z23 Encounter for immunization: Secondary | ICD-10-CM | POA: Diagnosis not present

## 2020-06-03 DIAGNOSIS — I10 Essential (primary) hypertension: Secondary | ICD-10-CM | POA: Diagnosis not present

## 2020-06-03 DIAGNOSIS — K219 Gastro-esophageal reflux disease without esophagitis: Secondary | ICD-10-CM | POA: Diagnosis not present

## 2020-06-04 ENCOUNTER — Telehealth: Payer: Self-pay | Admitting: Interventional Cardiology

## 2020-06-04 NOTE — Telephone Encounter (Signed)
Called patient back to let him know our office would update his list. Patient stated his Crestor was increased to 40 mg. Informed patient that he would not need to hold any of his medications for his ETT . Patient verbalized understanding.

## 2020-06-04 NOTE — Telephone Encounter (Signed)
    Pt c/o medication issue:  1. Name of Medication: levothyroxine (SYNTHROID) 88 MCG tablet  losartan-hydrochlorothiazide (HYZAAR) 100-12.5 MG tablet    pantoprazole (PROTONIX) 40 MG tablet    rosuvastatin (CRESTOR) 20 MG tablet    2. How are you currently taking this medication (dosage and times per day)?   3. Are you having a reaction (difficulty breathing--STAT)?   4. What is your medication issue? Pt said per [place] he needs to call to give medications he currently taking, he said this is for his upcoming ETT to let him know if he needs to hold some meds prior procedure

## 2020-06-10 ENCOUNTER — Ambulatory Visit (INDEPENDENT_AMBULATORY_CARE_PROVIDER_SITE_OTHER): Payer: BC Managed Care – PPO

## 2020-06-10 ENCOUNTER — Other Ambulatory Visit: Payer: Self-pay

## 2020-06-10 DIAGNOSIS — R931 Abnormal findings on diagnostic imaging of heart and coronary circulation: Secondary | ICD-10-CM | POA: Diagnosis not present

## 2020-06-10 DIAGNOSIS — I2510 Atherosclerotic heart disease of native coronary artery without angina pectoris: Secondary | ICD-10-CM | POA: Diagnosis not present

## 2020-06-10 LAB — EXERCISE TOLERANCE TEST
Estimated workload: 7 METS
Exercise duration (min): 4 min
Exercise duration (sec): 44 s
MPHR: 158 {beats}/min
Peak HR: 142 {beats}/min
Percent HR: 89 %
RPE: 15
Rest HR: 77 {beats}/min

## 2020-06-18 DIAGNOSIS — G43719 Chronic migraine without aura, intractable, without status migrainosus: Secondary | ICD-10-CM | POA: Diagnosis not present

## 2020-07-02 DIAGNOSIS — G43719 Chronic migraine without aura, intractable, without status migrainosus: Secondary | ICD-10-CM | POA: Diagnosis not present

## 2020-07-02 DIAGNOSIS — M542 Cervicalgia: Secondary | ICD-10-CM | POA: Diagnosis not present

## 2020-07-03 DIAGNOSIS — Z13220 Encounter for screening for lipoid disorders: Secondary | ICD-10-CM | POA: Diagnosis not present

## 2020-07-03 DIAGNOSIS — Z125 Encounter for screening for malignant neoplasm of prostate: Secondary | ICD-10-CM | POA: Diagnosis not present

## 2020-07-03 DIAGNOSIS — Z0000 Encounter for general adult medical examination without abnormal findings: Secondary | ICD-10-CM | POA: Diagnosis not present

## 2020-07-03 DIAGNOSIS — Z131 Encounter for screening for diabetes mellitus: Secondary | ICD-10-CM | POA: Diagnosis not present

## 2020-08-19 DIAGNOSIS — G43719 Chronic migraine without aura, intractable, without status migrainosus: Secondary | ICD-10-CM | POA: Diagnosis not present

## 2020-08-19 DIAGNOSIS — M542 Cervicalgia: Secondary | ICD-10-CM | POA: Diagnosis not present

## 2020-09-15 DIAGNOSIS — G43719 Chronic migraine without aura, intractable, without status migrainosus: Secondary | ICD-10-CM | POA: Diagnosis not present

## 2020-10-08 DIAGNOSIS — M542 Cervicalgia: Secondary | ICD-10-CM | POA: Diagnosis not present

## 2020-10-08 DIAGNOSIS — G43719 Chronic migraine without aura, intractable, without status migrainosus: Secondary | ICD-10-CM | POA: Diagnosis not present

## 2020-11-19 DIAGNOSIS — M542 Cervicalgia: Secondary | ICD-10-CM | POA: Diagnosis not present

## 2020-11-19 DIAGNOSIS — G43719 Chronic migraine without aura, intractable, without status migrainosus: Secondary | ICD-10-CM | POA: Diagnosis not present

## 2020-12-05 DIAGNOSIS — I10 Essential (primary) hypertension: Secondary | ICD-10-CM | POA: Diagnosis not present

## 2020-12-05 DIAGNOSIS — E782 Mixed hyperlipidemia: Secondary | ICD-10-CM | POA: Diagnosis not present

## 2020-12-05 DIAGNOSIS — F410 Panic disorder [episodic paroxysmal anxiety] without agoraphobia: Secondary | ICD-10-CM | POA: Diagnosis not present

## 2020-12-05 DIAGNOSIS — J309 Allergic rhinitis, unspecified: Secondary | ICD-10-CM | POA: Diagnosis not present

## 2020-12-10 DIAGNOSIS — G43719 Chronic migraine without aura, intractable, without status migrainosus: Secondary | ICD-10-CM | POA: Diagnosis not present

## 2020-12-11 ENCOUNTER — Other Ambulatory Visit: Payer: Self-pay | Admitting: Internal Medicine

## 2020-12-11 ENCOUNTER — Ambulatory Visit
Admission: RE | Admit: 2020-12-11 | Discharge: 2020-12-11 | Disposition: A | Discharge status: Home / Self Care | Payer: BC Managed Care – PPO | Source: Ambulatory Visit | Attending: Internal Medicine | Admitting: Internal Medicine

## 2020-12-11 DIAGNOSIS — M25521 Pain in right elbow: Secondary | ICD-10-CM

## 2021-01-13 DIAGNOSIS — R0981 Nasal congestion: Secondary | ICD-10-CM | POA: Diagnosis not present

## 2021-01-13 DIAGNOSIS — J339 Nasal polyp, unspecified: Secondary | ICD-10-CM | POA: Diagnosis not present

## 2021-04-14 DIAGNOSIS — M25521 Pain in right elbow: Secondary | ICD-10-CM | POA: Diagnosis not present

## 2021-05-04 DIAGNOSIS — R000 Tachycardia, unspecified: Secondary | ICD-10-CM | POA: Diagnosis not present

## 2021-05-06 DIAGNOSIS — M25521 Pain in right elbow: Secondary | ICD-10-CM | POA: Diagnosis not present

## 2021-06-02 DIAGNOSIS — Z5181 Encounter for therapeutic drug level monitoring: Secondary | ICD-10-CM | POA: Diagnosis not present

## 2021-06-02 DIAGNOSIS — Z0000 Encounter for general adult medical examination without abnormal findings: Secondary | ICD-10-CM | POA: Diagnosis not present

## 2021-06-02 DIAGNOSIS — E7800 Pure hypercholesterolemia, unspecified: Secondary | ICD-10-CM | POA: Diagnosis not present

## 2021-06-02 DIAGNOSIS — Z125 Encounter for screening for malignant neoplasm of prostate: Secondary | ICD-10-CM | POA: Diagnosis not present

## 2021-06-05 DIAGNOSIS — Z0000 Encounter for general adult medical examination without abnormal findings: Secondary | ICD-10-CM | POA: Diagnosis not present

## 2021-07-13 IMAGING — CR DG ELBOW COMPLETE 3+V*R*
4 series · 4 of 4 positions shown · non-contrast
Comparison: None.

CLINICAL DATA: Post fall 1 month ago with persistent elbow pain.

EXAM:
RIGHT ELBOW - COMPLETE 3+ VIEW

[x elbow joint ap right (1 of 3)]
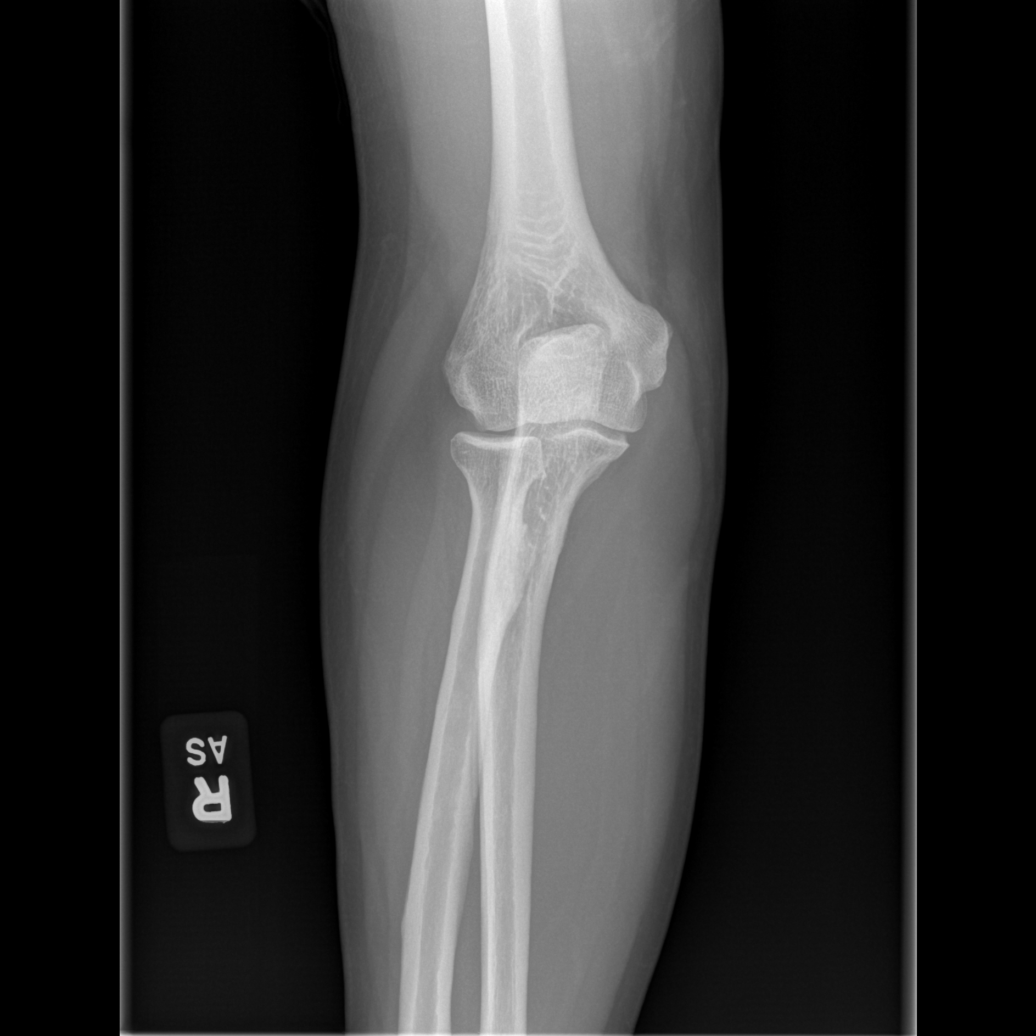

[x elbow joint lat right]
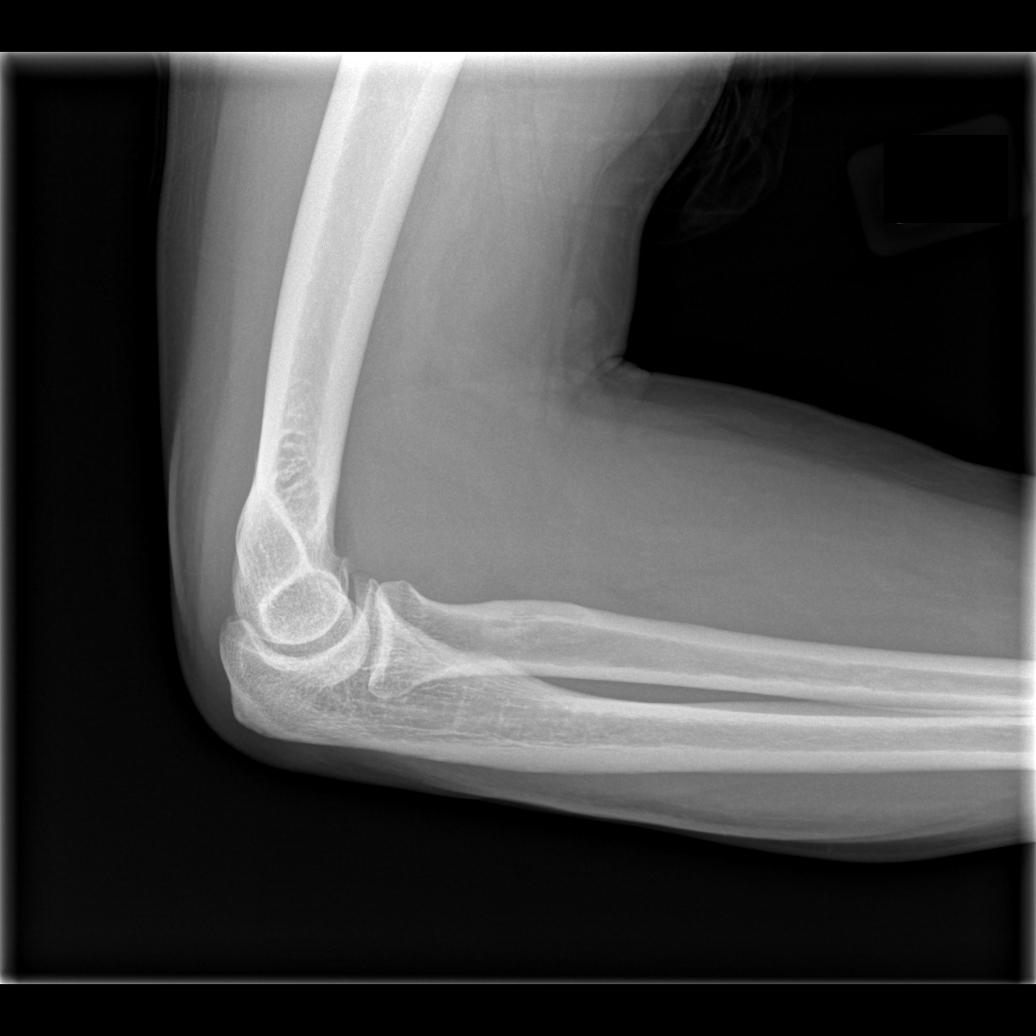

[x elbow joint ap right (2 of 3)]
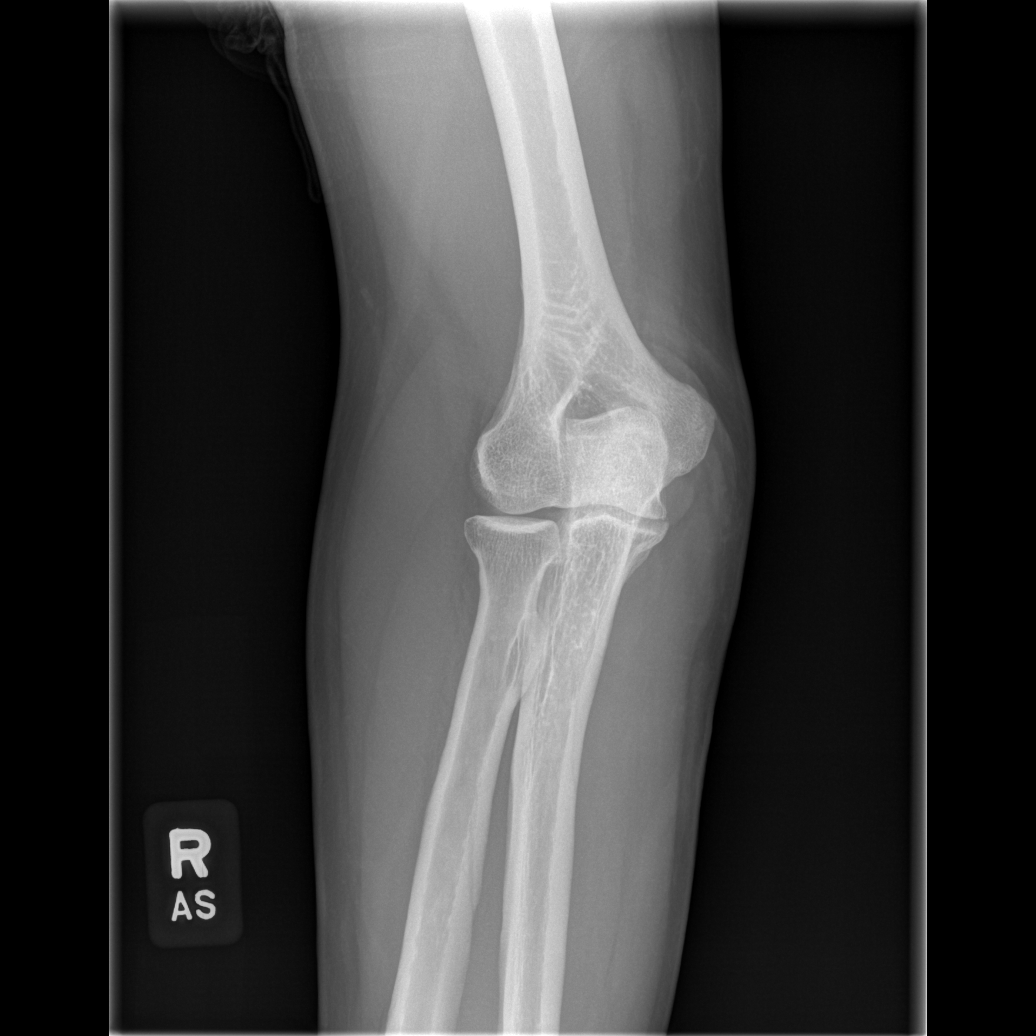

[x elbow joint ap right (3 of 3)]
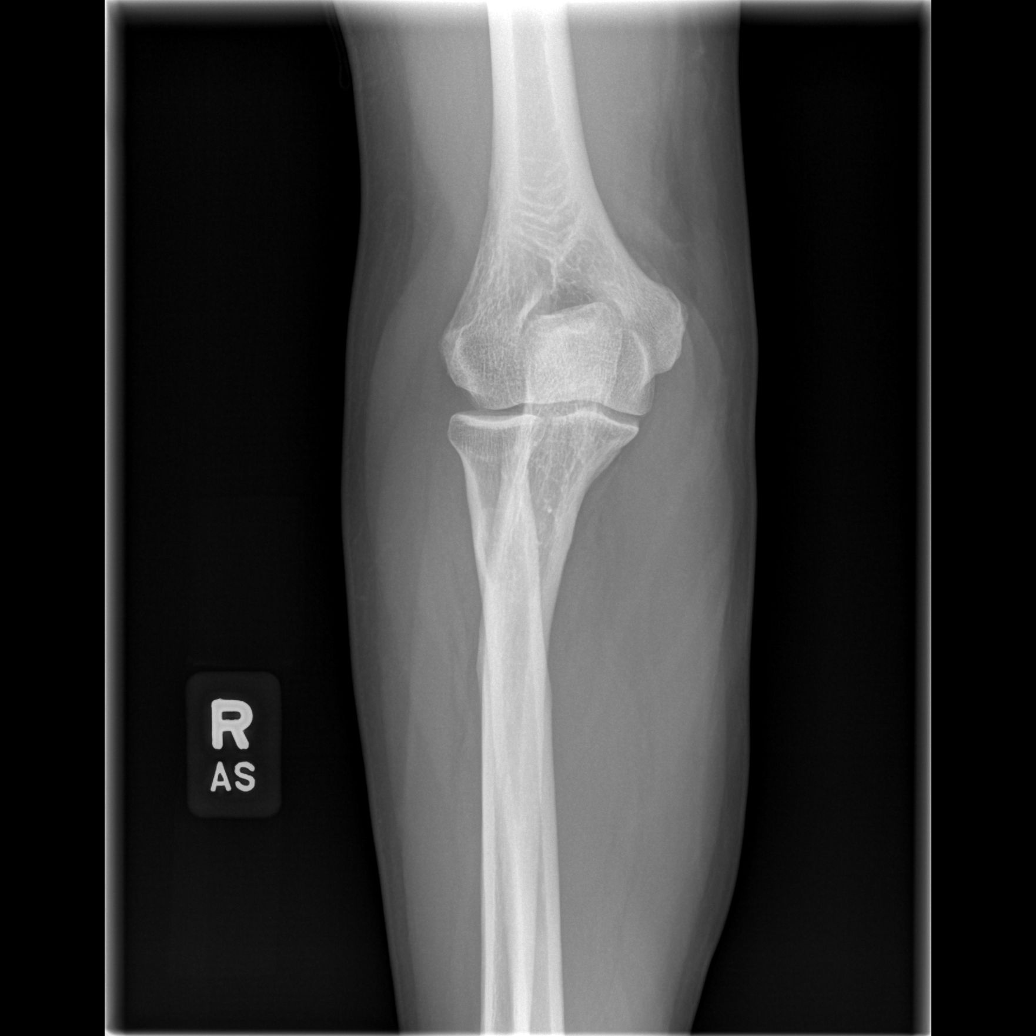

[4 of 4 positions shown; findings below may reference images not displayed]

FINDINGS: No fracture or elbow joint effusion. Joint spaces appear preserved.
Regional soft tissues appear normal.
IMPRESSION: No fracture or elbow joint effusion.

## 2021-07-23 DIAGNOSIS — L918 Other hypertrophic disorders of the skin: Secondary | ICD-10-CM | POA: Diagnosis not present

## 2021-09-07 DIAGNOSIS — E039 Hypothyroidism, unspecified: Secondary | ICD-10-CM | POA: Diagnosis not present

## 2021-09-11 ENCOUNTER — Telehealth: Payer: Self-pay | Admitting: Interventional Cardiology

## 2021-09-11 DIAGNOSIS — R051 Acute cough: Secondary | ICD-10-CM | POA: Diagnosis not present

## 2021-09-11 DIAGNOSIS — R6883 Chills (without fever): Secondary | ICD-10-CM | POA: Diagnosis not present

## 2021-09-11 DIAGNOSIS — J029 Acute pharyngitis, unspecified: Secondary | ICD-10-CM | POA: Diagnosis not present

## 2021-09-11 DIAGNOSIS — U071 COVID-19: Secondary | ICD-10-CM | POA: Diagnosis not present

## 2021-09-11 NOTE — Telephone Encounter (Signed)
   Patient called reporting that he tested positive for COVID today.  Symptoms of sore throat stated yesterday and he tested negative at that time on a home test.    Given HTN, hyperlipidemia, and coronary calcification, will treat with 5 days of Paxlovid.  Patient instructed to hold Crestor while on Paxlovid.   Rx called in to [facility] [phone]  Corky Crafts, MD

## 2021-10-21 DIAGNOSIS — E039 Hypothyroidism, unspecified: Secondary | ICD-10-CM | POA: Diagnosis not present

## 2021-12-16 ENCOUNTER — Other Ambulatory Visit: Payer: Self-pay | Admitting: Internal Medicine

## 2021-12-16 DIAGNOSIS — R911 Solitary pulmonary nodule: Secondary | ICD-10-CM

## 2021-12-16 DIAGNOSIS — K219 Gastro-esophageal reflux disease without esophagitis: Secondary | ICD-10-CM | POA: Diagnosis not present

## 2021-12-16 DIAGNOSIS — I10 Essential (primary) hypertension: Secondary | ICD-10-CM | POA: Diagnosis not present

## 2021-12-16 DIAGNOSIS — R0789 Other chest pain: Secondary | ICD-10-CM | POA: Diagnosis not present

## 2021-12-16 DIAGNOSIS — E782 Mixed hyperlipidemia: Secondary | ICD-10-CM | POA: Diagnosis not present

## 2022-01-11 ENCOUNTER — Other Ambulatory Visit: Payer: Self-pay

## 2022-01-11 ENCOUNTER — Ambulatory Visit
Admission: RE | Admit: 2022-01-11 | Discharge: 2022-01-11 | Disposition: A | Discharge status: Home / Self Care | Payer: BC Managed Care – PPO | Source: Ambulatory Visit | Attending: Internal Medicine | Admitting: Internal Medicine

## 2022-01-11 DIAGNOSIS — R911 Solitary pulmonary nodule: Secondary | ICD-10-CM | POA: Diagnosis not present

## 2022-10-25 DIAGNOSIS — J029 Acute pharyngitis, unspecified: Secondary | ICD-10-CM | POA: Diagnosis not present

## 2022-10-25 DIAGNOSIS — J069 Acute upper respiratory infection, unspecified: Secondary | ICD-10-CM | POA: Diagnosis not present

## 2022-11-01 DIAGNOSIS — J0100 Acute maxillary sinusitis, unspecified: Secondary | ICD-10-CM | POA: Diagnosis not present

## 2022-11-01 DIAGNOSIS — J40 Bronchitis, not specified as acute or chronic: Secondary | ICD-10-CM | POA: Diagnosis not present

## 2022-12-13 DIAGNOSIS — Z0000 Encounter for general adult medical examination without abnormal findings: Secondary | ICD-10-CM | POA: Diagnosis not present

## 2022-12-14 DIAGNOSIS — E782 Mixed hyperlipidemia: Secondary | ICD-10-CM | POA: Diagnosis not present

## 2022-12-14 DIAGNOSIS — R7309 Other abnormal glucose: Secondary | ICD-10-CM | POA: Diagnosis not present

## 2022-12-14 DIAGNOSIS — Z0000 Encounter for general adult medical examination without abnormal findings: Secondary | ICD-10-CM | POA: Diagnosis not present

## 2022-12-14 DIAGNOSIS — Z125 Encounter for screening for malignant neoplasm of prostate: Secondary | ICD-10-CM | POA: Diagnosis not present

## 2022-12-14 DIAGNOSIS — E039 Hypothyroidism, unspecified: Secondary | ICD-10-CM | POA: Diagnosis not present

## 2022-12-14 DIAGNOSIS — I10 Essential (primary) hypertension: Secondary | ICD-10-CM | POA: Diagnosis not present

## 2023-01-11 DIAGNOSIS — M542 Cervicalgia: Secondary | ICD-10-CM | POA: Diagnosis not present

## 2023-01-11 DIAGNOSIS — I10 Essential (primary) hypertension: Secondary | ICD-10-CM | POA: Diagnosis not present

## 2023-04-27 DIAGNOSIS — B349 Viral infection, unspecified: Secondary | ICD-10-CM | POA: Diagnosis not present

## 2023-06-09 DIAGNOSIS — R7303 Prediabetes: Secondary | ICD-10-CM | POA: Diagnosis not present

## 2023-06-09 DIAGNOSIS — I10 Essential (primary) hypertension: Secondary | ICD-10-CM | POA: Diagnosis not present

## 2023-06-09 DIAGNOSIS — I77810 Thoracic aortic ectasia: Secondary | ICD-10-CM | POA: Diagnosis not present

## 2023-06-09 DIAGNOSIS — E039 Hypothyroidism, unspecified: Secondary | ICD-10-CM | POA: Diagnosis not present

## 2023-06-09 DIAGNOSIS — I700 Atherosclerosis of aorta: Secondary | ICD-10-CM | POA: Diagnosis not present

## 2023-06-14 DIAGNOSIS — R7303 Prediabetes: Secondary | ICD-10-CM | POA: Diagnosis not present

## 2023-06-14 DIAGNOSIS — E039 Hypothyroidism, unspecified: Secondary | ICD-10-CM | POA: Diagnosis not present

## 2024-06-21 DIAGNOSIS — E039 Hypothyroidism, unspecified: Secondary | ICD-10-CM | POA: Diagnosis not present

## 2024-06-21 DIAGNOSIS — M519 Unspecified thoracic, thoracolumbar and lumbosacral intervertebral disc disorder: Secondary | ICD-10-CM | POA: Diagnosis not present

## 2024-06-21 DIAGNOSIS — R519 Headache, unspecified: Secondary | ICD-10-CM | POA: Diagnosis not present

## 2024-06-21 DIAGNOSIS — F410 Panic disorder [episodic paroxysmal anxiety] without agoraphobia: Secondary | ICD-10-CM | POA: Diagnosis not present

## 2024-06-21 DIAGNOSIS — I77810 Thoracic aortic ectasia: Secondary | ICD-10-CM | POA: Diagnosis not present

## 2024-06-21 DIAGNOSIS — R7303 Prediabetes: Secondary | ICD-10-CM | POA: Diagnosis not present

## 2024-06-21 DIAGNOSIS — I10 Essential (primary) hypertension: Secondary | ICD-10-CM | POA: Diagnosis not present

## 2024-06-21 DIAGNOSIS — I700 Atherosclerosis of aorta: Secondary | ICD-10-CM | POA: Diagnosis not present

## 2024-06-21 DIAGNOSIS — I2510 Atherosclerotic heart disease of native coronary artery without angina pectoris: Secondary | ICD-10-CM | POA: Diagnosis not present

## 2024-06-21 DIAGNOSIS — E782 Mixed hyperlipidemia: Secondary | ICD-10-CM | POA: Diagnosis not present

## 2024-08-21 DIAGNOSIS — E039 Hypothyroidism, unspecified: Secondary | ICD-10-CM | POA: Diagnosis not present

## 2024-09-19 DIAGNOSIS — M25512 Pain in left shoulder: Secondary | ICD-10-CM | POA: Diagnosis not present

## 2024-09-19 DIAGNOSIS — M542 Cervicalgia: Secondary | ICD-10-CM | POA: Diagnosis not present

## 2024-09-19 DIAGNOSIS — M5090 Cervical disc disorder, unspecified, unspecified cervical region: Secondary | ICD-10-CM | POA: Diagnosis not present

## 2024-09-19 DIAGNOSIS — Z23 Encounter for immunization: Secondary | ICD-10-CM | POA: Diagnosis not present

## 2024-09-23 DIAGNOSIS — M542 Cervicalgia: Secondary | ICD-10-CM | POA: Diagnosis not present

## 2024-09-23 DIAGNOSIS — M4802 Spinal stenosis, cervical region: Secondary | ICD-10-CM | POA: Diagnosis not present

## 2024-09-23 DIAGNOSIS — M5081 Other cervical disc disorders,  high cervical region: Secondary | ICD-10-CM | POA: Diagnosis not present

## 2024-09-23 DIAGNOSIS — M25512 Pain in left shoulder: Secondary | ICD-10-CM | POA: Diagnosis not present

## 2024-09-23 DIAGNOSIS — M47812 Spondylosis without myelopathy or radiculopathy, cervical region: Secondary | ICD-10-CM | POA: Diagnosis not present

## 2024-09-23 DIAGNOSIS — M19012 Primary osteoarthritis, left shoulder: Secondary | ICD-10-CM | POA: Diagnosis not present

## 2024-10-09 DIAGNOSIS — M25512 Pain in left shoulder: Secondary | ICD-10-CM | POA: Diagnosis not present

## 2024-10-15 DIAGNOSIS — M25512 Pain in left shoulder: Secondary | ICD-10-CM | POA: Diagnosis not present
